# Patient Record
Sex: Female | Born: 1980 | Race: White | Hispanic: Yes | Marital: Married | Smoking: Never smoker
Health system: Southern US, Community
[De-identification: ages and names within clinical notes are randomized; demographics above are authoritative.]

---

## 2002-12-27 ENCOUNTER — Emergency Department (HOSPITAL_COMMUNITY): Admission: EM | Admit: 2002-12-27 | Discharge: 2002-12-28 | Payer: Self-pay | Admitting: Emergency Medicine

## 2003-01-27 ENCOUNTER — Inpatient Hospital Stay (HOSPITAL_COMMUNITY): Admission: AD | Admit: 2003-01-27 | Discharge: 2003-01-27 | Payer: Self-pay | Admitting: Obstetrics & Gynecology

## 2003-01-27 ENCOUNTER — Encounter (INDEPENDENT_AMBULATORY_CARE_PROVIDER_SITE_OTHER): Payer: Self-pay | Admitting: *Deleted

## 2003-01-30 ENCOUNTER — Inpatient Hospital Stay (HOSPITAL_COMMUNITY): Admission: RE | Admit: 2003-01-30 | Discharge: 2003-01-30 | Payer: Self-pay | Admitting: Obstetrics & Gynecology

## 2003-06-09 ENCOUNTER — Inpatient Hospital Stay (HOSPITAL_COMMUNITY): Admission: AD | Admit: 2003-06-09 | Discharge: 2003-06-10 | Payer: Self-pay | Admitting: Obstetrics & Gynecology

## 2003-08-30 ENCOUNTER — Ambulatory Visit (HOSPITAL_COMMUNITY): Admission: RE | Admit: 2003-08-30 | Discharge: 2003-08-30 | Payer: Self-pay | Admitting: *Deleted

## 2003-10-20 ENCOUNTER — Inpatient Hospital Stay (HOSPITAL_COMMUNITY): Admission: AD | Admit: 2003-10-20 | Discharge: 2003-10-20 | Payer: Self-pay | Admitting: *Deleted

## 2003-10-21 ENCOUNTER — Inpatient Hospital Stay (HOSPITAL_COMMUNITY): Admission: AD | Admit: 2003-10-21 | Discharge: 2003-10-21 | Payer: Self-pay | Admitting: Gynecology

## 2003-11-18 ENCOUNTER — Ambulatory Visit: Payer: Self-pay | Admitting: Family Medicine

## 2003-11-18 ENCOUNTER — Inpatient Hospital Stay (HOSPITAL_COMMUNITY): Admission: AD | Admit: 2003-11-18 | Discharge: 2003-11-20 | Payer: Self-pay | Admitting: Family Medicine

## 2003-11-29 ENCOUNTER — Ambulatory Visit (HOSPITAL_COMMUNITY): Admission: RE | Admit: 2003-11-29 | Discharge: 2003-11-29 | Payer: Self-pay | Admitting: *Deleted

## 2003-12-20 ENCOUNTER — Inpatient Hospital Stay (HOSPITAL_COMMUNITY): Admission: AD | Admit: 2003-12-20 | Discharge: 2003-12-20 | Payer: Self-pay | Admitting: *Deleted

## 2004-01-10 ENCOUNTER — Inpatient Hospital Stay (HOSPITAL_COMMUNITY): Admission: AD | Admit: 2004-01-10 | Discharge: 2004-01-10 | Payer: Self-pay | Admitting: *Deleted

## 2004-01-11 ENCOUNTER — Inpatient Hospital Stay (HOSPITAL_COMMUNITY): Admission: AD | Admit: 2004-01-11 | Discharge: 2004-01-11 | Payer: Self-pay | Admitting: *Deleted

## 2004-01-19 ENCOUNTER — Inpatient Hospital Stay (HOSPITAL_COMMUNITY): Admission: AD | Admit: 2004-01-19 | Discharge: 2004-01-19 | Payer: Self-pay | Admitting: Family Medicine

## 2004-01-23 ENCOUNTER — Inpatient Hospital Stay (HOSPITAL_COMMUNITY): Admission: RE | Admit: 2004-01-23 | Discharge: 2004-01-26 | Payer: Self-pay | Admitting: Obstetrics & Gynecology

## 2004-01-23 ENCOUNTER — Ambulatory Visit: Payer: Self-pay | Admitting: Family Medicine

## 2004-01-31 ENCOUNTER — Inpatient Hospital Stay (HOSPITAL_COMMUNITY): Admission: AD | Admit: 2004-01-31 | Discharge: 2004-01-31 | Payer: Self-pay | Admitting: *Deleted

## 2006-07-03 ENCOUNTER — Encounter: Admission: RE | Admit: 2006-07-03 | Discharge: 2006-07-03 | Payer: Self-pay | Admitting: Family Medicine

## 2006-08-23 IMAGING — US US OB FOLLOW-UP
1 series · 13 of 28 positions shown · non-contrast
Comparison: none

CLINICAL DATA: 31 week 6 day assigned gestational age.  Measuring large for dates.  Evaluate fetal growth and amniotic fluid.

[Series 1: unknown · 13 of 50 slices shown]
[im 2/50]
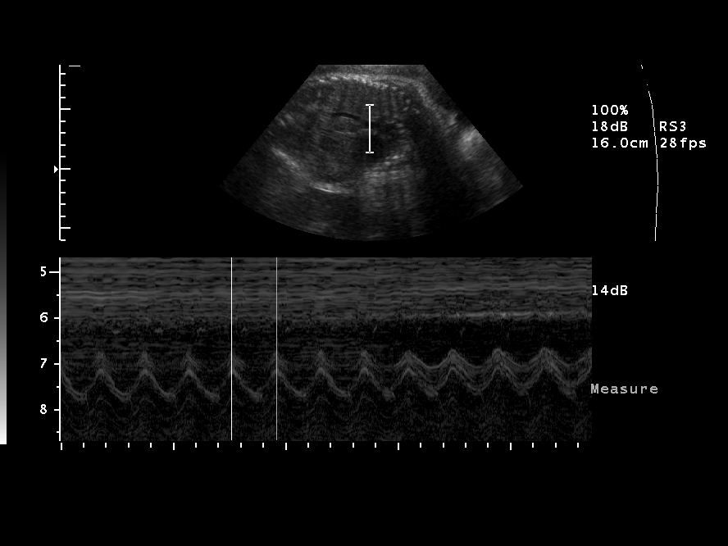
[im 6/50]
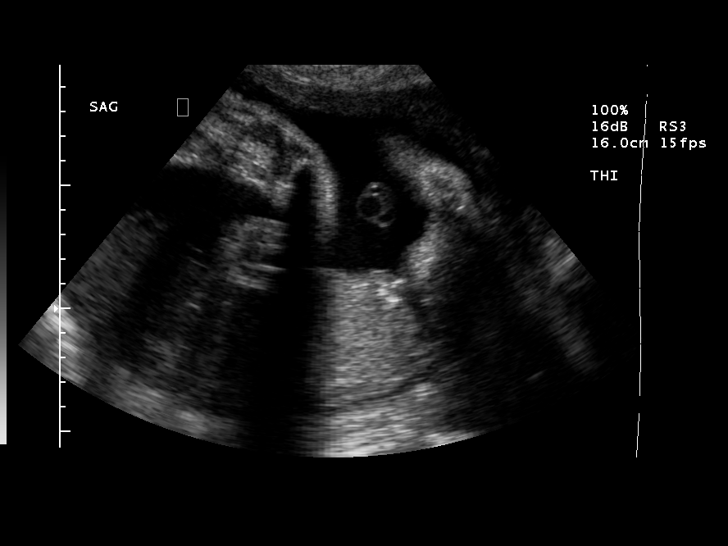
[im 10/50]
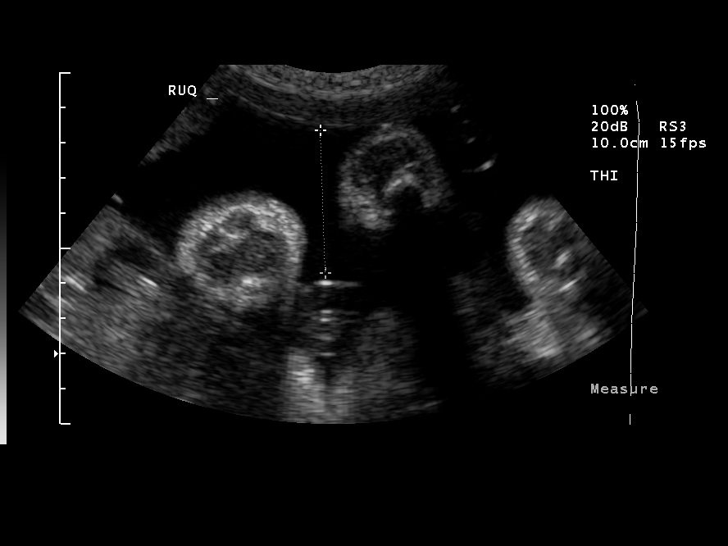
[im 13/50]
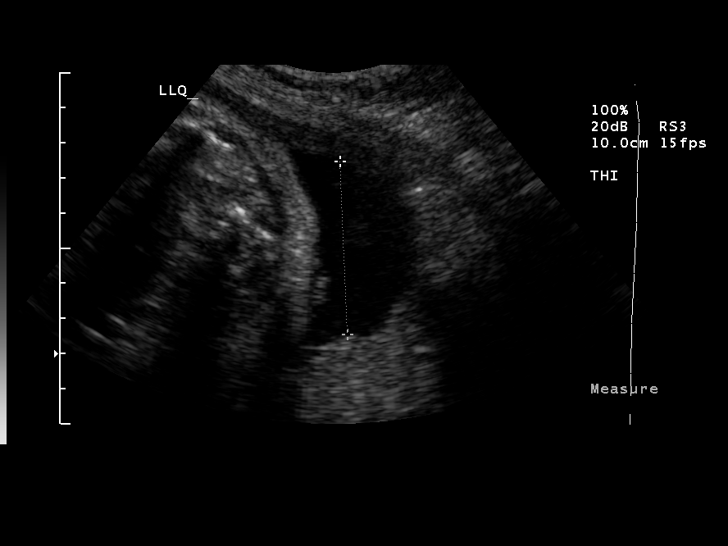
[im 17/50]
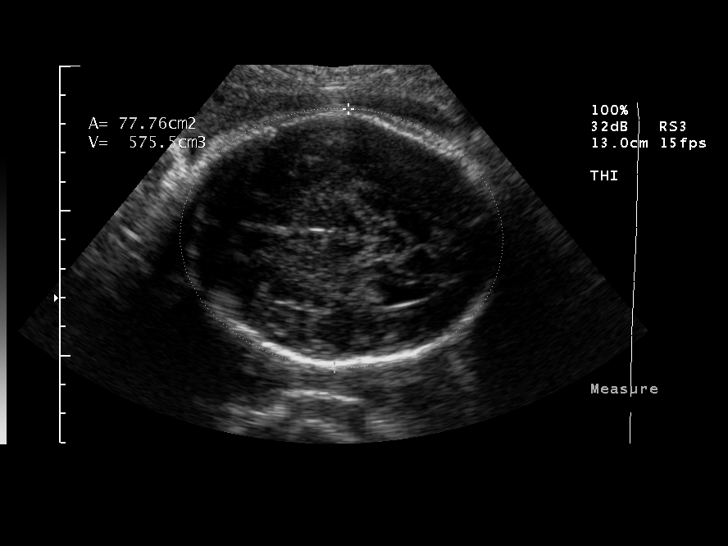
[im 20/50]
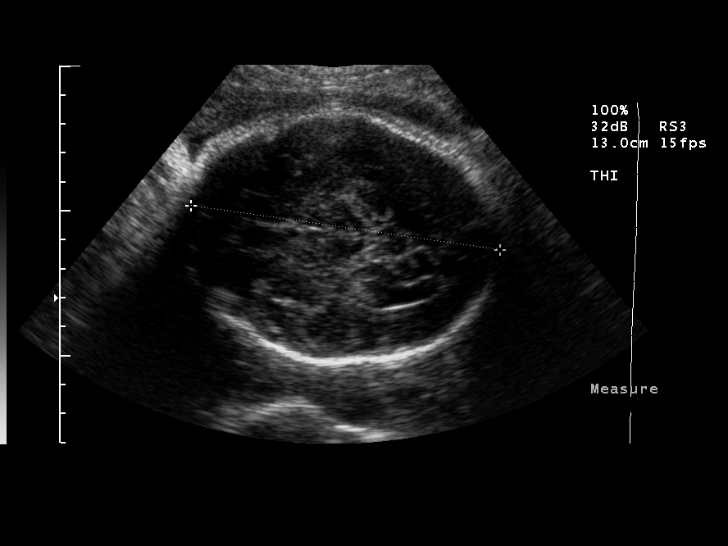
[im 26/50]
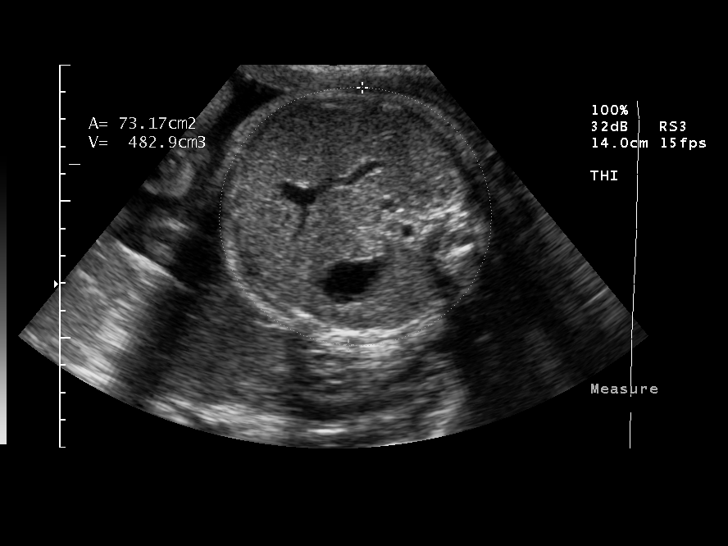
[im 30/50]
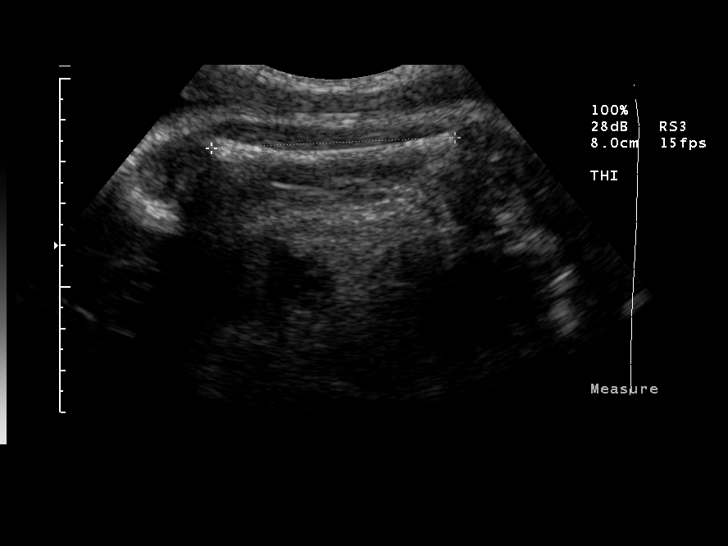
[im 33/50]
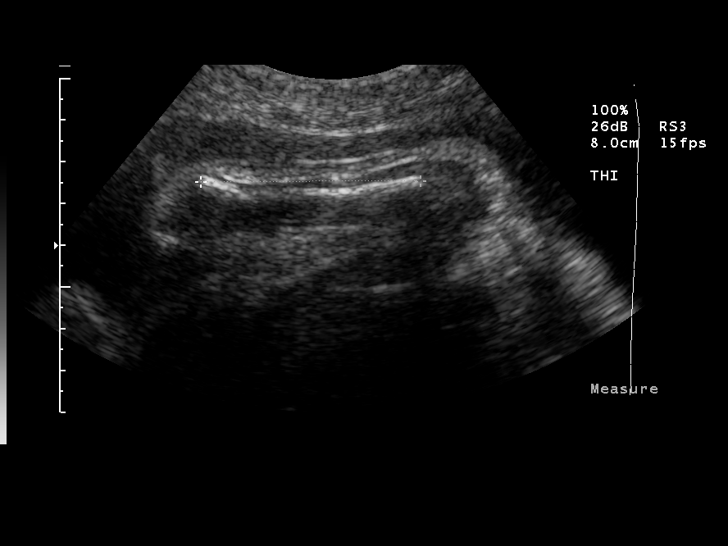
[im 37/50]
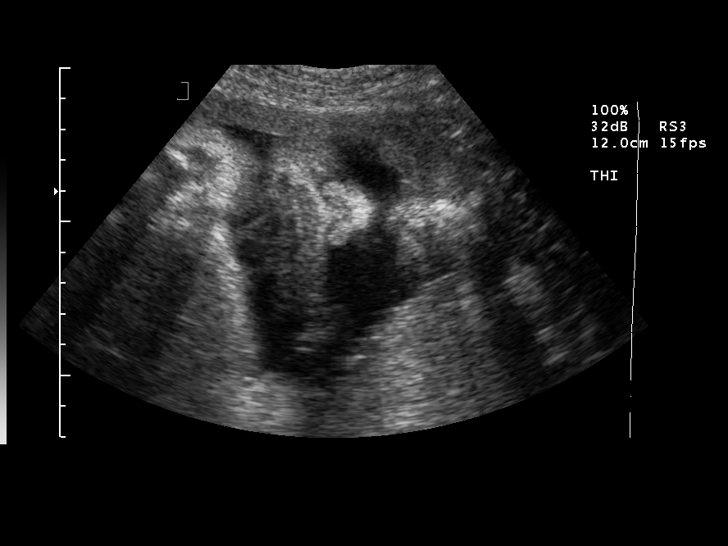
[im 40/50]
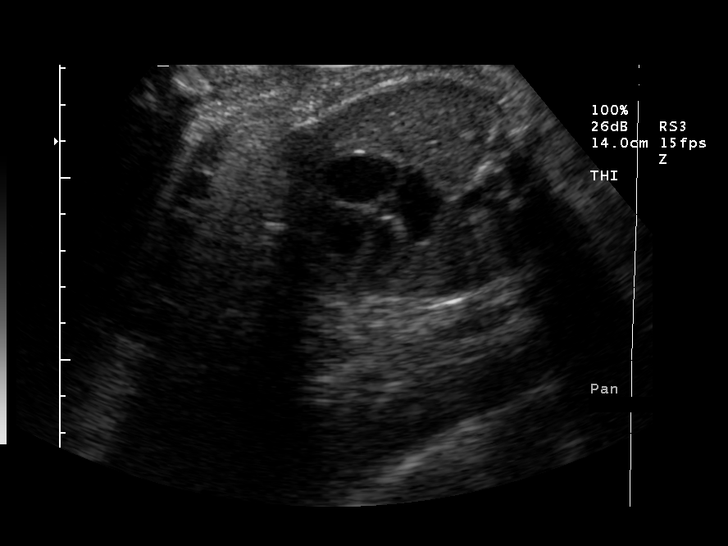
[im 44/50]
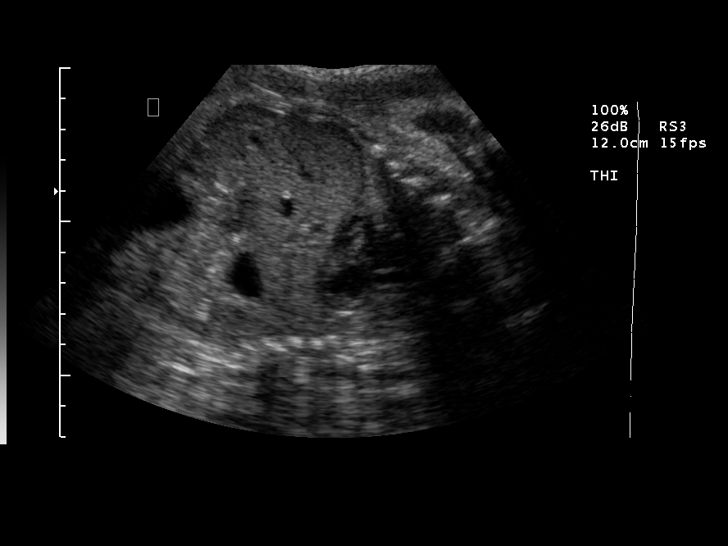
[im 48/50]
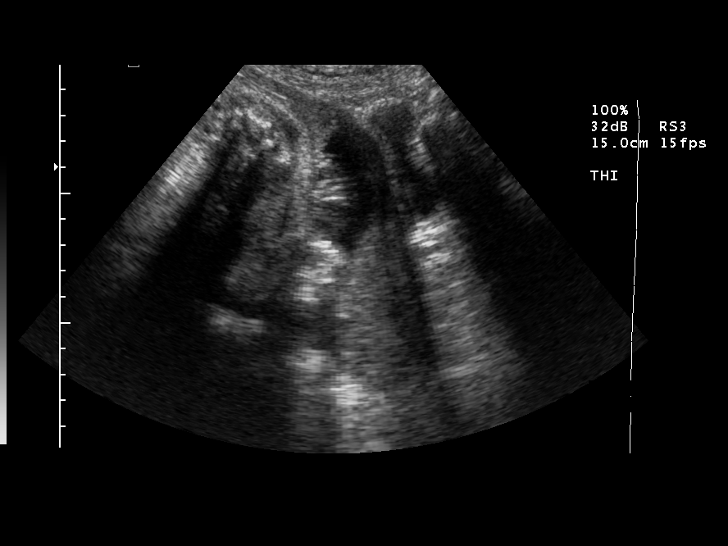

[13 of 28 positions shown; findings below may reference images not displayed]

OBSTETRICAL ULTRASOUND RE-EVALUATION
 Number of Fetuses:  1
 Heart Rate:  150
 Movement:  Yes
 Breathing:  Yes
 Presentation:  Cephalic
 Placental Location:  Posterior
 Grade:  II
 Previa:  No
 Amniotic Fluid (subjective):  Normal
 Amniotic Fluid (objective):  19.1 cm AFI (5th -95th%ile = 8.6 – 24.2 cm for 32 wks)

 FETAL BIOMETRY
 BPD:  8.4 cm   33 w 5 d
 HC:  31.9 cm  36 w 0 d
 AC:  30.1 cm   34 w 2 d
 FL:   5.9 cm   30 w 5 d
 HL:   5.3 cm   30 w 5 d
 Mean GA:  33 w 1 d
 Assigned GA:  31 w 6 d

 EFW:  4459 g (H) 90th – 95th%ile (0896 – 5154 g) for 32 wks

 FETAL ANATOMY
 Lateral Ventricles:  Visualized 
 Thalami/CSP:  Previously seen 
 Posterior Fossa:  Previously seen 
 Nuchal Region:  Previously seen 
 Spine:  Previously seen 
 4 Chamber Heart on Left:  Visualized 
 Stomach on Left:  Visualized 
 3 Vessel Cord:  Visualized 
 Cord Insertion Site:  Previously seen 
 Kidneys:  Visualized 
 Bladder:  Visualized 
 Extremities:  Previously seen 
 MATERNAL FINDINGS
 Cervix:  5.0 cm Transabdominally
IMPRESSION: Assigned gestational age is currently 31 weeks 6 days.  Abdominal circumference is greater than 2 weeks ahead, and EFW is at the 90th – 95th percentile, suspicious for fetal macrosomia.  
 Amniotic fluid volume within normal limits with AFI of 19.1 cm.

## 2008-06-08 ENCOUNTER — Inpatient Hospital Stay (HOSPITAL_COMMUNITY): Admission: AD | Admit: 2008-06-08 | Discharge: 2008-06-08 | Payer: Self-pay | Admitting: Obstetrics & Gynecology

## 2008-07-26 ENCOUNTER — Ambulatory Visit: Payer: Self-pay | Admitting: Obstetrics and Gynecology

## 2008-07-26 ENCOUNTER — Inpatient Hospital Stay (HOSPITAL_COMMUNITY): Admission: AD | Admit: 2008-07-26 | Discharge: 2008-07-26 | Payer: Self-pay | Admitting: Obstetrics & Gynecology

## 2008-09-16 ENCOUNTER — Ambulatory Visit (HOSPITAL_COMMUNITY): Admission: RE | Admit: 2008-09-16 | Discharge: 2008-09-16 | Payer: Self-pay | Admitting: Obstetrics & Gynecology

## 2008-11-29 ENCOUNTER — Inpatient Hospital Stay (HOSPITAL_COMMUNITY): Admission: AD | Admit: 2008-11-29 | Discharge: 2008-11-29 | Payer: Self-pay | Admitting: Obstetrics & Gynecology

## 2008-11-29 ENCOUNTER — Ambulatory Visit: Payer: Self-pay | Admitting: Family

## 2009-01-01 ENCOUNTER — Encounter: Payer: Self-pay | Admitting: Obstetrics & Gynecology

## 2009-01-01 ENCOUNTER — Ambulatory Visit: Payer: Self-pay | Admitting: Obstetrics & Gynecology

## 2009-01-01 ENCOUNTER — Inpatient Hospital Stay (HOSPITAL_COMMUNITY): Admission: AD | Admit: 2009-01-01 | Discharge: 2009-01-03 | Payer: Self-pay | Admitting: Obstetrics & Gynecology

## 2010-05-30 LAB — CBC
HCT: 31 % — ABNORMAL LOW (ref 36.0–46.0)
HCT: 32 % — ABNORMAL LOW (ref 36.0–46.0)
HCT: 39.1 % (ref 36.0–46.0)
Hemoglobin: 10.7 g/dL — ABNORMAL LOW (ref 12.0–15.0)
Hemoglobin: 10.9 g/dL — ABNORMAL LOW (ref 12.0–15.0)
Hemoglobin: 13.3 g/dL (ref 12.0–15.0)
MCHC: 34 g/dL (ref 30.0–36.0)
MCHC: 34.2 g/dL (ref 30.0–36.0)
MCHC: 34.3 g/dL (ref 30.0–36.0)
MCV: 97.5 fL (ref 78.0–100.0)
MCV: 97.6 fL (ref 78.0–100.0)
MCV: 97.8 fL (ref 78.0–100.0)
Platelets: 211 10*3/uL (ref 150–400)
Platelets: 235 10*3/uL (ref 150–400)
Platelets: 244 10*3/uL (ref 150–400)
RBC: 3.18 MIL/uL — ABNORMAL LOW (ref 3.87–5.11)
RBC: 3.27 MIL/uL — ABNORMAL LOW (ref 3.87–5.11)
RBC: 4.01 MIL/uL (ref 3.87–5.11)
RDW: 13.8 % (ref 11.5–15.5)
RDW: 13.9 % (ref 11.5–15.5)
RDW: 14 % (ref 11.5–15.5)
WBC: 10.1 10*3/uL (ref 4.0–10.5)
WBC: 13.5 10*3/uL — ABNORMAL HIGH (ref 4.0–10.5)
WBC: 9.1 10*3/uL (ref 4.0–10.5)

## 2010-05-30 LAB — RPR: RPR Ser Ql: NONREACTIVE

## 2010-05-31 LAB — WET PREP, GENITAL
Clue Cells Wet Prep HPF POC: NONE SEEN
Yeast Wet Prep HPF POC: NONE SEEN

## 2010-05-31 LAB — URINALYSIS, ROUTINE W REFLEX MICROSCOPIC
Bilirubin Urine: NEGATIVE
Glucose, UA: NEGATIVE mg/dL
Hgb urine dipstick: NEGATIVE
Ketones, ur: NEGATIVE mg/dL
Nitrite: NEGATIVE
Protein, ur: NEGATIVE mg/dL
Specific Gravity, Urine: <1.005 — ABNORMAL LOW (ref 1.005–1.030)
Urobilinogen, UA: 0.2 mg/dL (ref 0.0–1.0)
pH: 6 (ref 5.0–8.0)

## 2010-05-31 LAB — URINE MICROSCOPIC-ADD ON

## 2010-06-04 LAB — WET PREP, GENITAL
Clue Cells Wet Prep HPF POC: NONE SEEN
Trich, Wet Prep: NONE SEEN
Yeast Wet Prep HPF POC: NONE SEEN

## 2010-06-04 LAB — URINALYSIS, ROUTINE W REFLEX MICROSCOPIC
Bilirubin Urine: NEGATIVE
Hgb urine dipstick: NEGATIVE
Ketones, ur: NEGATIVE mg/dL
Nitrite: POSITIVE — AB
Protein, ur: NEGATIVE mg/dL
Specific Gravity, Urine: 1.015 (ref 1.005–1.030)
Urobilinogen, UA: 0.2 mg/dL (ref 0.0–1.0)

## 2010-06-04 LAB — URINE CULTURE

## 2010-06-04 LAB — URINE MICROSCOPIC-ADD ON

## 2010-06-15 ENCOUNTER — Emergency Department (HOSPITAL_COMMUNITY)
Admission: EM | Admit: 2010-06-15 | Discharge: 2010-06-15 | Disposition: A | Discharge status: Home / Self Care | Payer: Self-pay | Attending: Emergency Medicine | Admitting: Emergency Medicine

## 2010-06-15 DIAGNOSIS — R059 Cough, unspecified: Secondary | ICD-10-CM | POA: Insufficient documentation

## 2010-06-15 DIAGNOSIS — R109 Unspecified abdominal pain: Secondary | ICD-10-CM | POA: Insufficient documentation

## 2010-06-15 DIAGNOSIS — R05 Cough: Secondary | ICD-10-CM | POA: Insufficient documentation

## 2010-06-15 DIAGNOSIS — K3189 Other diseases of stomach and duodenum: Secondary | ICD-10-CM | POA: Insufficient documentation

## 2010-06-15 DIAGNOSIS — R350 Frequency of micturition: Secondary | ICD-10-CM | POA: Insufficient documentation

## 2010-06-15 DIAGNOSIS — J3489 Other specified disorders of nose and nasal sinuses: Secondary | ICD-10-CM | POA: Insufficient documentation

## 2010-06-15 DIAGNOSIS — R490 Dysphonia: Secondary | ICD-10-CM | POA: Insufficient documentation

## 2010-06-15 DIAGNOSIS — R630 Anorexia: Secondary | ICD-10-CM | POA: Insufficient documentation

## 2010-06-15 DIAGNOSIS — J029 Acute pharyngitis, unspecified: Secondary | ICD-10-CM | POA: Insufficient documentation

## 2010-06-15 DIAGNOSIS — R300 Dysuria: Secondary | ICD-10-CM | POA: Insufficient documentation

## 2010-06-15 DIAGNOSIS — R3915 Urgency of urination: Secondary | ICD-10-CM | POA: Insufficient documentation

## 2010-06-15 LAB — URINALYSIS, ROUTINE W REFLEX MICROSCOPIC
Bilirubin Urine: NEGATIVE
Glucose, UA: NEGATIVE mg/dL
Hgb urine dipstick: NEGATIVE
Specific Gravity, Urine: 1.029 (ref 1.005–1.030)
pH: 6 (ref 5.0–8.0)

## 2010-06-17 ENCOUNTER — Emergency Department (HOSPITAL_COMMUNITY): Payer: Self-pay

## 2010-06-17 ENCOUNTER — Emergency Department (HOSPITAL_COMMUNITY)
Admission: EM | Admit: 2010-06-17 | Discharge: 2010-06-17 | Disposition: A | Discharge status: Home / Self Care | Payer: Self-pay | Attending: Emergency Medicine | Admitting: Emergency Medicine

## 2010-06-17 DIAGNOSIS — R05 Cough: Secondary | ICD-10-CM | POA: Insufficient documentation

## 2010-06-17 DIAGNOSIS — R0609 Other forms of dyspnea: Secondary | ICD-10-CM | POA: Insufficient documentation

## 2010-06-17 DIAGNOSIS — J189 Pneumonia, unspecified organism: Secondary | ICD-10-CM | POA: Insufficient documentation

## 2010-06-17 DIAGNOSIS — R070 Pain in throat: Secondary | ICD-10-CM | POA: Insufficient documentation

## 2010-06-17 DIAGNOSIS — R209 Unspecified disturbances of skin sensation: Secondary | ICD-10-CM | POA: Insufficient documentation

## 2010-06-17 DIAGNOSIS — R5383 Other fatigue: Secondary | ICD-10-CM | POA: Insufficient documentation

## 2010-06-17 DIAGNOSIS — R599 Enlarged lymph nodes, unspecified: Secondary | ICD-10-CM | POA: Insufficient documentation

## 2010-06-17 DIAGNOSIS — R630 Anorexia: Secondary | ICD-10-CM | POA: Insufficient documentation

## 2010-06-17 DIAGNOSIS — R0602 Shortness of breath: Secondary | ICD-10-CM | POA: Insufficient documentation

## 2010-06-17 DIAGNOSIS — R059 Cough, unspecified: Secondary | ICD-10-CM | POA: Insufficient documentation

## 2010-06-17 DIAGNOSIS — R51 Headache: Secondary | ICD-10-CM | POA: Insufficient documentation

## 2010-06-17 DIAGNOSIS — R0989 Other specified symptoms and signs involving the circulatory and respiratory systems: Secondary | ICD-10-CM | POA: Insufficient documentation

## 2010-06-17 DIAGNOSIS — R5381 Other malaise: Secondary | ICD-10-CM | POA: Insufficient documentation

## 2010-06-18 ENCOUNTER — Emergency Department (HOSPITAL_COMMUNITY)
Admission: EM | Admit: 2010-06-18 | Discharge: 2010-06-18 | Disposition: A | Discharge status: Home / Self Care | Payer: Self-pay | Attending: Emergency Medicine | Admitting: Emergency Medicine

## 2010-06-18 DIAGNOSIS — R05 Cough: Secondary | ICD-10-CM | POA: Insufficient documentation

## 2010-06-18 DIAGNOSIS — R059 Cough, unspecified: Secondary | ICD-10-CM | POA: Insufficient documentation

## 2010-06-18 DIAGNOSIS — F410 Panic disorder [episodic paroxysmal anxiety] without agoraphobia: Secondary | ICD-10-CM | POA: Insufficient documentation

## 2010-06-18 DIAGNOSIS — R0609 Other forms of dyspnea: Secondary | ICD-10-CM | POA: Insufficient documentation

## 2010-06-18 DIAGNOSIS — J189 Pneumonia, unspecified organism: Secondary | ICD-10-CM | POA: Insufficient documentation

## 2010-06-18 DIAGNOSIS — R0989 Other specified symptoms and signs involving the circulatory and respiratory systems: Secondary | ICD-10-CM | POA: Insufficient documentation

## 2010-07-03 ENCOUNTER — Inpatient Hospital Stay (INDEPENDENT_AMBULATORY_CARE_PROVIDER_SITE_OTHER)
Admission: RE | Admit: 2010-07-03 | Discharge: 2010-07-03 | Disposition: A | Discharge status: Home / Self Care | Payer: Self-pay | Source: Ambulatory Visit | Attending: Family Medicine | Admitting: Family Medicine

## 2010-07-03 ENCOUNTER — Ambulatory Visit (INDEPENDENT_AMBULATORY_CARE_PROVIDER_SITE_OTHER): Payer: Self-pay

## 2010-07-03 DIAGNOSIS — J40 Bronchitis, not specified as acute or chronic: Secondary | ICD-10-CM

## 2010-07-13 NOTE — Discharge Summary (Signed)
NAMEMADELYNNE, LASKER      ACCOUNT NO.:  [id number]   MEDICAL RECORD NO.:  [id number]          PATIENT TYPE:  INP   LOCATION:  9157                          [facility]   PHYSICIAN:  Tanya S. Shawnie Pons, M.D.   DATE OF BIRTH:  Mar 17, 1980   DATE OF ADMISSION:  11/18/2003  DATE OF DISCHARGE:  11/20/2003                                 DISCHARGE SUMMARY   DISCHARGE DIAGNOSES:  1.  Threatened preterm labor.  2.  Abdominal pain.   HISTORY OF PRESENTING ILLNESS:  This is a 30 year old gravida 5 para 3-0-1-3  who presented at 47 and [redacted] weeks gestation by 6-week ultrasound with  abdominal pain for approximately 15 hours.  She states the pain felt like  contractions, was located at her umbilicus and radiated to her left flank.  She had no other complaints including fever, nausea, vomiting, diarrhea, or  bleeding.  She was having no changes in level of fetal activity.   HOSPITAL COURSE:  #1 - THREATENED PRETERM LABOR.  Initial cervical exam was  a fingertip with progression to fingertip to 1 cm.  She was started on  Indocin, Procardia, and magnesium sulfate, and was given her first dose of  steroids to assist in lung maturity.  She was placed on continuous uterine  monitoring, and during her course of stay she did have improvement in her  level of uterine irritability and decrease in her rate of contractions.  At  the time of discharge, her cervix was fingertip, thick, and high.   #2 - ABDOMINAL PAIN.  Following admission she underwent complete abdominal  ultrasound and transabdominal pelvic ultrasound - both of which showed no  significant abnormalities, ruling out cholecystitis, cholelithiasis, renal  calculi, ovarian torsion or cyst.  The patient's pain was treated with  morphine p.r.n.  A complete laboratory workup was performed which showed a  mildly decreased total protein with an albumin of 2.8, mild anemia at  hemoglobin 11.3.  Otherwise, her laboratory results were completely  within  normal limits.  Urinalysis was negative.  At time of discharge, her level of  pain had improved significantly, and she was discharged home with close  follow-up.   RESULTS OF LABORATORIES AND STUDIES:  Metabolic panel:  Sodium 136,  potassium 3.8, chloride 106, bicarb 23, BUN 3, creatinine 0.5, glucose 89,  calcium 8.9, protein 5.8, albumin 2.8, AST 15, ALT 12, alk phos 75,  bilirubin 0.5.  Wbc's 8.3, hemoglobin 11.8, hematocrit 33, platelet count  280.  Urinalysis negative.  Abdominal ultrasound showed a small amount of  gallbladder sludge without sonographic evidence for cholelithiasis or  cholecystitis and mild dilation of the renal collecting system consistent  with pregnancy.  Pelvic ultrasound showed right and left ovaries normal in  size and appearance with no evidence of free fluid.  Color Doppler  evaluation showed normal flow.  Obstetrical ultrasound showed a singleton  fetus with a heart rate of 145 and positive fetal movement, at presentation  was variable placental location that was posterior grade 2 without previa,  amniotic fluid index of 19.9, mean gestational age of measurements is 33  weeks 1 day.  No  fetal abnormalities were noted on the scan.   DISCHARGE INSTRUCTIONS:  1.  She is discharged home.  2.  She is to follow up at the clinic in 2 days and call for an appointment.  3.  She is to return for any signs or symptoms of labor, decreased fetal      activity, or other concerns.  4.  She is to maintain light activity.  5.  She may eat a regular diet.   DISCHARGE MEDICATIONS:  Prenatal vitamins one tablet daily.      MB/MEDQ  D:  01/13/2004  T:  01/13/2004  Job:  [id number]

## 2010-07-13 NOTE — Discharge Summary (Signed)
NAMEFELISHIA, Martha Santos      ACCOUNT NO.:  [id number]   MEDICAL RECORD NO.:  [id number]          PATIENT TYPE:  INP   LOCATION:  9157                          [facility]   PHYSICIAN:  Tanya S. Shawnie Pons, M.D.   DATE OF BIRTH:  [birth year]   DATE OF ADMISSION:  11/18/2003  DATE OF DISCHARGE:  11/20/2003                                 DISCHARGE SUMMARY   DISCHARGE DIAGNOSES:  1.  Intrauterine pregnancy at [redacted] weeks gestation.  2.  Abdominal pain.   HISTORY OF PRESENTING ILLNESS:  This is a 30 year old gravida 5 para 3-0-1-3  who presented with abdominal pain since the night before which felt somewhat  similar to contractions.  The pain did radiate to her left flank.  She  denied any fever, nausea, vomiting, or diarrhea.  Obstetrical history  significant for one vaginal delivery, two low transverse cesarean sections,  and a spontaneous abortion.   HOSPITAL COURSE:  ABDOMINAL PAIN.  She was having some irregular  contractions on the monitor in maternity admissions and was given  terbutaline with near cessation of her contractions.  She underwent an  ultrasound for amniotic fluid index and cervical length.  Cervical length  was 3.5 cm, estimated mean gestational age was 33 weeks 1 day, estimated  fetal weight greater than the 95th percentile, and follow-up ultrasound was  recommended as both head and abdominal circumference measured greater than  gestational age.  AFI was 19.9 cm.  She also underwent complete abdominal  ultrasound to rule out any pathologic abdominal sources for her pain.  The  gallbladder had a small amount of echogenic sludge but no evidence of  gallbladder wall thickening, pericystic fluid, or gallstones.  There was no  bile duct dilatation.  Right and left kidneys were normal.  Both ovaries  were normal in size and appearance.  Evidence suggesting acute appendicitis  was not visualized.  Her pain did gradually improve some throughout her  hospital stay.  She  received both tocolytics as well as Stadol and morphine  with some improvement of her pain.  At time of discharge, her pain is stable  and any pathologic sources have been ruled out.  She will be having close  follow-up following discharge with her regular obstetrical physician.   MONITOR INFORMATION:  Fetal heart rate is in the 140s with good variability,  accelerations are present, no decelerations are present.  Uterine  contractions are rare, at less than 2 per hour.   LABORATORY AND STUDIES:  Imaging studies as noted in the hospital course.  Vaginal group B strep is negative.  RPR nonreactive.  Urinalysis negative.  Metabolic panel showed a BUN of 3 with creatinine of 0.5, albumin of 2.8,  AST of 15, ALT of 12, bilirubin of 0.5.  Hemoglobin was 11.2 with hematocrit  of 33, platelet count of 280.   DISCHARGE INSTRUCTIONS:  1.  She is discharged home with her husband.  2.  She is to maintain light activity only.  3.  She is to continue to eat a healthy diet.  4.  She is to call or return for any changes in the character of  her      abdominal pain, any worsening of her pain, or any recurrence of uterine      contractions occurring in regular intervals for a prolonged period of      time, as well as for any rupture of membranes or other symptoms of      labor.  5.  She is to follow up at [facility] on Wednesday; she needs to call      for an appointment.   DISCHARGE MEDICATIONS:  1.  Prenatal vitamins one tablet p.o. daily.  2.  Tylenol 650 mg p.o. q.4-6h. as needed for pain.  3.  Tums as needed for gastroesophageal reflux symptoms.      MB/MEDQ  D:  11/20/2003  T:  11/21/2003  Job:  [id number]

## 2010-07-13 NOTE — Op Note (Signed)
Martha Santos, Martha Santos NO.:  [id number]   MEDICAL RECORD NO.:  [id number]          PATIENT TYPE:  INP   LOCATION:  9135                          [facility]  [facility]   PHYSICIAN:  Lesly Dukes, M.D. DATE OF BIRTH:  25-Jun-1980   DATE OF PROCEDURE:  01/23/2004  DATE OF DISCHARGE:                                 OPERATIVE REPORT   PREOPERATIVE DIAGNOSES:  30 year old, para 3-0-1-3 at 7 weeks estimated  gestational age for elective repeat low flap transverse cesarean section.   POSTOPERATIVE DIAGNOSES:  30 year old, para 3-0-1-3 at 52 weeks estimated  gestational age for elective repeat low flap transverse cesarean section.   PROCEDURE:  Repeat low flap transverse cesarean section.   SURGEON:  Lesly Dukes, M.D.   ASSISTANT:  Shelbie Proctor. Shawnie Pons, M.D.   ANESTHESIA:  Spinal.   ESTIMATED BLOOD LOSS:  800 mL.   IV FLUIDS:  2700 mL.   URINE OUTPUT:  400 mL clear.   FINDINGS:  Viable female infant; Apgar's 9 at 1 minute and 9 at 5 minutes;  vertex presentation; clear fluid; weight equals 9 pounds 5 ounces; normal  uterus, ovaries and fallopian tubes grossly; lower uterine segment thin but  intact; minimal scar tissue in the intraabdominal cavity; ICU team at  delivery.   DESCRIPTION OF PROCEDURE:  After informed consent was obtained, the patient  was taken to the operating room where spinal anesthesia was found to be  adequate.  There was an interpreter present during the consent for the  procedure as well as during the surgery itself. The patient was placed in  the dorsal supine position and prepared and draped in the normal sterile  fashion. There was a leftward tilt to the table as well.  The Foley was in  the bladder. A Pfannenstiel skin incision was made with the scalpel and  carried down to the underlying layer of fascia.  The fascia was incised in  the midline, this incision extended bilaterally with the Mayo scissors. The  superior and inferior aspects of  the fascial incision were grasped with  Kocher clamps, tented up and dissected off sharply and bluntly from the  underlying layers of rectus muscles.  The rectus muscles were separated in  the midline. The peritoneum was identified, tented up and entered sharply  with the Metzenbaum scissors. This incision was extended both superiorly and  inferiorly with good visualization of the bladder. The bladder blade was  inserted, vesicouterine peritoneum was identified, tented up and entered  sharply with the Metzenbaum scissors. This incision was extended bilaterally  and the bladder flap was then created digitally. The bladder blade was  reinserted. The uterus was incised in the lower uterine segment with a  scalpel and this incision was extended bilaterally with the bandage  scissors.  The amniotic sac was ruptured and clear fluid was noted.  The  baby's head delivered without incident and the nose and mouth were  suctioned. The baby's body delivered easily and the cord was clamped and cut  and the baby handed off to the waiting pediatrician. Cord blood was sent for  type and screen.  The placenta delivered  spontaneously with three vessel  cord. The uterus was cleared of all clots and debris. The uterine incision  was closed with #0 Vicryl in a running locked fashion and good hemostasis  was noted.  The gutters were cleared of all clots and debris and the adnexa  were identified and noted to be grossly normal.  The uterine incision was  noted to be hemostatic one last time. The peritoneum and rectus muscles were  hemostatic.  The fascia was closed with #0 Vicryl in a running fashion. The  subcutaneous tissue was copiously irrigated and found to be hemostatic.  The  skin was closed with staples.  The patient tolerated the procedure well.  Sponge, lap, instrument and needle counts were correct x2 and the patient  went to the recovery room in stable condition.      KHL/MEDQ  D:  01/23/2004  T:   01/23/2004  Job:  [id number]

## 2010-07-13 NOTE — Discharge Summary (Signed)
Martha Santos, Martha Santos      ACCOUNT NO.:  [id number]   MEDICAL RECORD NO.:  [id number]          PATIENT TYPE:  INP   LOCATION:  9135                          [facility]   PHYSICIAN:  Lesly Dukes, M.D. DATE OF BIRTH:  [birth year]   DATE OF ADMISSION:  01/23/2004  DATE OF DISCHARGE:  01/26/2004                                 DISCHARGE SUMMARY   DISCHARGE DIAGNOSES:  1.  A 32 week intrauterine pregnancy.  2.  Full-term delivery of a viable infant by repeat low transverse C      section.   DISCHARGE MEDICATIONS:  1.  Percocet 325/5 mg, 1-2 tabs q.4-6h. p.r.n. pain.  2.  Ibuprofen 600 mg, 1 tab q.6 h. p.r.n. for pain.  3.  Prenatal vitamins 1 tab q.d. while breast feeding.   DISCHARGE INSTRUCTIONS:  1.  Regular diet.  2.  Avoid strenuous activities for six weeks.  3.  Sexual activity, nothing in vagina for six weeks.   FOLLOW UP:  Return to [facility] in six weeks for postpartum check and  return to Maternity Admission Unit in 2-3 days for removal of staples.  To  schedule a family planning appointment to have an IUD placed after six weeks  postpartum at [facility].   HISTORY:  A 30 year old, G5, P4-0-1-4 at the time of admission that  presented at 39 weeks for a scheduled repeat __________ low transverse C  section.  The patient denied any prenatal problems.  She was GBS positive,  no allergies.  Denied significant past medical history.  OB history, the  patient had a history of low transverse C section for two and one D&C.  Family history, the patient denied any significant open __________ family  history and had a physical examination within normal limits with a gravid  abdomen and a uterine size compatible with a 40 week pregnancy.   REVIEW OF SYSTEMS:  The patient denied chest pain, shortness of breath,  nausea, vomiting or diarrhea or changes in urinary habits.   ADMISSION LABS:  . White blood cell count 6.0, hemoglobin 11.6, hematocrit  34,  platelets 269.   HOSPITAL COURSE:  On November 28, this patient had a repeat low transverse C  section and delivered a viable female infant with Apgar's of 9 at 1 minute and  9 at 5 minutes. See procedure dictation for details.  Had to manually remove  normal three vessel placenta and presented a benign postpartum coarse. She  was discharged on postpartum day #3.  Her baby was doing well and did not  require circumcision. The baby was breast feeding, had a pediatric  appointment on January 30, 2004 at 10:30 at [facility] with Dr.  Lubertha South.  This patient's blood type was A positive, RPR negative, rubella  titer immune, hepatitis B negative, GBS positive and HIV negative.  Discharge  CBC, white blood cell count 9.1, hemoglobin 10.5, hematocrit 30.5, platelets  240.  The patient and baby were discharged home in stable condition.  On the  day of discharge, the patient had no complaints, minimal pain and lochia and  remained afebrile through her postpartum course.  AM/MEDQ  D:  02/27/2004  T:  02/27/2004  Job:  [id number]

## 2010-11-14 ENCOUNTER — Inpatient Hospital Stay (INDEPENDENT_AMBULATORY_CARE_PROVIDER_SITE_OTHER)
Admission: RE | Admit: 2010-11-14 | Discharge: 2010-11-14 | Disposition: A | Discharge status: Home / Self Care | Payer: Self-pay | Source: Ambulatory Visit | Attending: Emergency Medicine | Admitting: Emergency Medicine

## 2010-11-14 DIAGNOSIS — N390 Urinary tract infection, site not specified: Secondary | ICD-10-CM

## 2010-11-14 LAB — POCT URINALYSIS DIP (DEVICE)
Bilirubin Urine: NEGATIVE
Glucose, UA: NEGATIVE mg/dL
Ketones, ur: NEGATIVE mg/dL
Nitrite: NEGATIVE
pH: 5.5 (ref 5.0–8.0)

## 2011-04-29 ENCOUNTER — Encounter (HOSPITAL_COMMUNITY): Payer: Self-pay

## 2011-04-29 ENCOUNTER — Emergency Department (HOSPITAL_COMMUNITY)
Admission: EM | Admit: 2011-04-29 | Discharge: 2011-04-29 | Disposition: A | Discharge status: Home / Self Care | Payer: Self-pay | Attending: Emergency Medicine | Admitting: Emergency Medicine

## 2011-04-29 DIAGNOSIS — H9209 Otalgia, unspecified ear: Secondary | ICD-10-CM | POA: Insufficient documentation

## 2011-04-29 DIAGNOSIS — H7290 Unspecified perforation of tympanic membrane, unspecified ear: Secondary | ICD-10-CM | POA: Insufficient documentation

## 2011-04-29 DIAGNOSIS — T169XXA Foreign body in ear, unspecified ear, initial encounter: Secondary | ICD-10-CM | POA: Insufficient documentation

## 2011-04-29 DIAGNOSIS — IMO0002 Reserved for concepts with insufficient information to code with codable children: Secondary | ICD-10-CM | POA: Insufficient documentation

## 2011-04-29 MED ORDER — AMOXICILLIN 500 MG PO CAPS
1000.0000 mg | ORAL_CAPSULE | Freq: Once | ORAL | Status: AC
  Administered 2011-04-29: 1000 mg via ORAL
  Filled 2011-04-29 (×2): qty 1

## 2011-04-29 MED ORDER — HYDROCODONE-ACETAMINOPHEN 5-325 MG PO TABS
1.0000 | ORAL_TABLET | Freq: Once | ORAL | Status: AC
  Administered 2011-04-29: 1 via ORAL
  Filled 2011-04-29: qty 1

## 2011-04-29 MED ORDER — AMOXICILLIN 500 MG PO CAPS: 1000.0000 mg | ORAL_CAPSULE | Freq: Two times a day (BID) | ORAL | Status: AC

## 2011-04-29 MED ORDER — HYDROCODONE-ACETAMINOPHEN 5-325 MG PO TABS: 1.0000 | ORAL_TABLET | ORAL | Status: AC | PRN

## 2011-04-29 NOTE — ED Provider Notes (Signed)
History     CSN: [id number]  Arrival date & time 04/29/11  1314   First MD Initiated Contact with Patient 04/29/11 1412      Chief Complaint  Patient presents with  . Otalgia    (Consider location/radiation/quality/duration/timing/severity/associated sxs/prior treatment) Patient is a 31 y.o. female presenting with ear pain. The history is provided by the patient.  Otalgia This is a new problem. The current episode started yesterday. There is pain in the right ear. The problem occurs constantly. The problem has not changed since onset.There has been no fever. Associated symptoms comments: Onset of right ear pain after using a q-tip to clean. She reports bleeding from the ear as well as decreased hearing. Marland Kitchen    History reviewed. No pertinent past medical history.  Past Surgical History  Procedure Date  . Cesarean section     History reviewed. No pertinent family history.  History  Substance Use Topics  . Smoking status: Never Smoker   . Smokeless tobacco: Not on file  . Alcohol Use: No    OB History    Grav Para Term Preterm Abortions TAB SAB Ect Mult Living                  Review of Systems  Constitutional: Negative for fever and chills.  HENT: Positive for ear pain.   Eyes: Negative.   Gastrointestinal: Negative.  Negative for nausea.  Neurological: Negative.  Negative for dizziness and light-headedness.    Allergies  Review of patient's allergies indicates no known allergies.  Home Medications  No current outpatient prescriptions on file.  BP 107/69  Pulse 86  Temp(Src) 98 F (36.7 C) (Oral)  Resp 18  SpO2 98%  LMP 04/25/2011  Physical Exam  Constitutional: She is oriented to person, place, and time. She appears well-developed and well-nourished.  HENT:       Right TM has small perforation centrally. There is a small foreign body at TM that appears to be cotton material. Blood is present in the external canal without hemotympanum.  Neck: Normal  range of motion.  Pulmonary/Chest: Effort normal.  Neurological: She is alert and oriented to person, place, and time.  Skin: Skin is warm and dry.    ED Course  Procedures (including critical care time)  Labs Reviewed - No data to display No results found.   No diagnosis found.    MDM          Rodena Medin, PA-C 04/29/11 1500

## 2011-04-29 NOTE — ED Notes (Signed)
Patient presents with right ear pain and bleeding after using a q-tip last night. Dried blood present to inner ear, no active bleeding.

## 2011-04-29 NOTE — Discharge Instructions (Signed)
FOLLOW UP WITH DR. Pollyann Kennedy FOR FURTHER EVALUATION AND TREATMENT OF RIGHT EAR INJURY AND FOREIGN BODY. TAKE MEDICATIONS AS PRESCRIBED. RETURN HERE AS NEEDED.   Cuerpo extrao en el odo  (Ear Foreign Body)  Un cuerpo extrao en el odo es un objeto que se ha insertado dentro del [place]. Estos objetos pueden causar dolor, prdida [place], zumbidos o crepitaciones. Tambin puede haber supuracin de lquido. CUIDADOS EN EL HOGAR   Cumpla con los controles mdicos segn las indicaciones.   Mantenga los objetos pequeas lejos del alcance de los nios. Dgales que no se los [employer] odos.  SOLICITE AYUDA DE INMEDIATO SI:   Observa sangre en el odo.   Siente un dolor cada vez ms intenso y observa inflamacin (hinchazn) en el odo.   Tiene problemas con la audicin.   Observa un lquido (secrecin) que proviene del odo.   Tiene fiebre.   Siente dolor de [place].  ASEGRESE DE QUE:   Comprende estas instrucciones.   Controlar su enfermedad.   Solicitar ayuda de inmediato si no mejora o si empeora.  Document Released: 01/31/2011 [facility] Patient Information 2012 [place], [place].  Tmpano perforado (Eardrum Perforation)  El tmpano (membrana timpnica) es un tejido delgado y redondeado [place] en el interior del odo, que separa el canal auditivo del odo [place]. Este tejido [employer] sonidos y Industrial/product designer. El tmpano puede pincharse o romperse (perforarse). Los tmpanos generalmente se curan sin [place] y dejan muy poca o ninguna prdida [place].  CAUSAS   Cambios repentinos en la presin que ocurren en situaciones con la prctica de buceo o viajar en avin.   Cuerpos extraos en el odo.   Insertar un hisopo con punta de algodn.   Sonidos fuertes.   Traumatismo en el odo.  SNTOMAS   Prdida auditiva.   Dolor de odos   Ruidos en el odo.   Tiene un sangrado o secrecin en el odo.   Mareos.   Vmitos.   Parlisis facial.    INSTRUCCIONES PARA EL CUIDADO DOMICILIARIO  Mantenga el odo seco para favorecer la curacin. No estn permitidas algunas [employer] natacin, el buceo y las duchas hasta que la curacin se complete. Al baarse, proteja el odo colocando un trozo de algodn cubierto con vaselina en el canal auditivo externo.   Utilice los medicamentos de venta libre o de prescripcin para Chief Technology Officer, Environmental health practitioner o la [place], segn se lo indique el profesional que lo asiste.   Suene su nariz suavemente. Si se suena con mucha fuerza, har que aumente la presin en el odo medio, lo que puede causar lesiones o demorar la curacin.   Reanude sus actividades normales como ducharse, cuando la perforacin se haya curado. El mdico le informar cuando esto haya ocurrido.   Hable con su mdico antes de viajar en avin. El tmpano perforado no le impide viajar por aire.   Si el profesional que lo [employer] pide que concurra a una cita de seguimiento, es importante asistir a ella. No concurrir a la Holiday representative como consecuencia una lesin crnica o [place], dolor, e incapacidad.  SOLICITE ATENCIN MDICA DE INMEDIATO SI:  Observa sangrado o pus en el odo.   Tiene problemas con el equilibrio, se siente mareado o presenta nuseas o vmitos.   Aumenta el dolor.   Tiene fiebre.  EST SEGURO QUE:   Comprende las instrucciones para el alta mdica.   Controlar su enfermedad.   Solicitar atencin mdica de  inmediato segn las indicaciones.  Document Released: 02/11/2005 Document Revised: 01/31/2011 [facility] Patient Information 2012 [place], [place].

## 2011-04-29 NOTE — ED Provider Notes (Signed)
Medical screening examination/treatment/procedure(s) were performed by non-physician practitioner and as supervising physician I was immediately available for consultation/collaboration.  Doug Sou, MD 04/29/11 [phone]

## 2011-05-11 ENCOUNTER — Ambulatory Visit: Payer: Self-pay | Admitting: Emergency Medicine

## 2011-05-11 DIAGNOSIS — R05 Cough: Secondary | ICD-10-CM

## 2011-05-11 DIAGNOSIS — J301 Allergic rhinitis due to pollen: Secondary | ICD-10-CM

## 2011-05-11 MED ORDER — METHYLPREDNISOLONE ACETATE 80 MG/ML IJ SUSP
80.0000 mg | Freq: Once | INTRAMUSCULAR | Status: AC
  Administered 2011-05-11: 80 mg via INTRAMUSCULAR

## 2011-05-11 MED ORDER — FLUTICASONE PROPIONATE 50 MCG/ACT NA SUSP: 2.0000 | Freq: Every day | NASAL | Status: DC

## 2011-05-11 MED ORDER — OLOPATADINE HCL 0.6 % NA SOLN: 2.0000 | Freq: Two times a day (BID) | NASAL | Status: DC

## 2011-05-11 NOTE — Progress Notes (Signed)
  Subjective:    Patient ID: Martha Santos, female    DOB: 1980-07-24, 31 y.o.   MRN: [id number]  HPI patient here with a chief complaint of severe allergies . She was seen here one year ago and she was given an injection of Depo-Medrol as well as nasal inhalers with patent nasal and Flonase  She has significant nasal congestion she denies any cough or shortness of breath. She has not had any wheezing. There is no possibility she is pregnant.  Review of Systems of systems is positive only as relates to her allergic symptoms     Objective:   Physical Exam  Constitutional: She appears well-developed.  HENT:  Head: Normocephalic.  Left Ear: External ear normal.       Right TM is scarred from a previous perforation. The nasal turbinates are swollen and blue. T he patient has a clear rhinorrhea   Neck: No JVD present. No tracheal deviation present. No thyromegaly present.  Cardiovascular: Normal rate and regular rhythm.   Pulmonary/Chest: No respiratory distress. She has no wheezes. She has no rales.  Lymphadenopathy:    She has no cervical adenopathy.          Assessment & Plan:  Assessment severe allergic rhinitis will treat with Depo-Medrol shot. As well as nasal inhalers with patanase and Flonase.

## 2011-05-11 NOTE — Patient Instructions (Signed)
Allergic Rhinitis  Allergic rhinitis is when the mucous membranes in the nose respond to allergens. Allergens are particles in the air that cause your body to have an allergic reaction. This causes you to release allergic antibodies. Through a chain of events, these eventually cause you to release histamine into the blood stream (hence the use of antihistamines). Although meant to be protective to the body, it is this release that causes your discomfort, such as frequent sneezing, congestion and an itchy runny nose.    CAUSES    The pollen allergens may come from grasses, trees, and weeds. This is seasonal allergic rhinitis, or "hay fever." Other allergens cause year-round allergic rhinitis (perennial allergic rhinitis) such as house dust mite allergen, pet dander and mold spores.    SYMPTOMS     Nasal stuffiness (congestion).   Runny, itchy nose with sneezing and tearing of the eyes.   There is often an itching of the mouth, eyes and ears.  It cannot be cured, but it can be controlled with medications.  DIAGNOSIS    If you are unable to determine the offending allergen, skin or blood testing may find it.  TREATMENT     Avoid the allergen.   Medications and allergy shots (immunotherapy) can help.   Hay fever may often be treated with antihistamines in pill or nasal spray forms. Antihistamines block the effects of histamine. There are over-the-counter medicines that may help with nasal congestion and swelling around the eyes. Check with your caregiver before taking or giving this medicine.  If the treatment above does not work, there are many new medications your caregiver can prescribe. Stronger medications may be used if initial measures are ineffective. Desensitizing injections can be used if medications and avoidance fails. Desensitization is when a patient is given ongoing shots until the body becomes less sensitive to the allergen. Make sure you follow up with your caregiver if problems continue.  SEEK  MEDICAL CARE IF:     You develop fever (more than 100.5 F (38.1 C).   You develop a cough that does not stop easily (persistent).   You have shortness of breath.   You start wheezing.   Symptoms interfere with normal daily activities.  Document Released: 11/06/2000 Document Revised: 01/31/2011 Document Reviewed: 05/18/2008  [employer] Patient Information 2012 [employer].

## 2011-08-25 ENCOUNTER — Encounter (HOSPITAL_COMMUNITY): Payer: Self-pay | Admitting: *Deleted

## 2011-08-25 ENCOUNTER — Emergency Department (HOSPITAL_COMMUNITY)
Admission: EM | Admit: 2011-08-25 | Discharge: 2011-08-25 | Disposition: A | Discharge status: Home / Self Care | Payer: No Typology Code available for payment source | Attending: Emergency Medicine | Admitting: Emergency Medicine

## 2011-08-25 DIAGNOSIS — M62830 Muscle spasm of back: Secondary | ICD-10-CM

## 2011-08-25 DIAGNOSIS — M5380 Other specified dorsopathies, site unspecified: Secondary | ICD-10-CM | POA: Insufficient documentation

## 2011-08-25 MED ORDER — HYDROMORPHONE HCL PF 1 MG/ML IJ SOLN
1.0000 mg | Freq: Once | INTRAMUSCULAR | Status: AC
  Administered 2011-08-25: 1 mg via INTRAMUSCULAR
  Filled 2011-08-25: qty 1

## 2011-08-25 MED ORDER — DIAZEPAM 5 MG PO TABS: 5.0000 mg | ORAL_TABLET | Freq: Three times a day (TID) | ORAL | Status: AC | PRN

## 2011-08-25 MED ORDER — HYDROCODONE-ACETAMINOPHEN 5-325 MG PO TABS: ORAL_TABLET | ORAL | Status: AC

## 2011-08-25 MED ORDER — DIAZEPAM 5 MG PO TABS
5.0000 mg | ORAL_TABLET | Freq: Once | ORAL | Status: AC
  Administered 2011-08-25: 5 mg via ORAL
  Filled 2011-08-25: qty 1

## 2011-08-25 MED ORDER — IBUPROFEN 400 MG PO TABS
400.0000 mg | ORAL_TABLET | Freq: Once | ORAL | Status: AC
  Administered 2011-08-25: 400 mg via ORAL
  Filled 2011-08-25: qty 1

## 2011-08-25 MED ORDER — IBUPROFEN 600 MG PO TABS: 600.0000 mg | ORAL_TABLET | Freq: Three times a day (TID) | ORAL | Status: AC
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
## 2011-08-25 NOTE — Discharge Instructions (Signed)
Narcotic and benzodiazepine use may cause drowsiness, slowed breathing or dependence.  Please use with caution and do not drive, operate machinery or watch young children alone while taking them.  Taking combinations of these medications or drinking alcohol will potentiate these effects.    
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
## 2012-03-26 ENCOUNTER — Emergency Department (HOSPITAL_COMMUNITY)
Admission: EM | Admit: 2012-03-26 | Discharge: 2012-03-26 | Disposition: A | Discharge status: Home / Self Care | Payer: No Typology Code available for payment source | Attending: Emergency Medicine | Admitting: Emergency Medicine

## 2012-03-26 ENCOUNTER — Encounter (HOSPITAL_COMMUNITY): Payer: Self-pay | Admitting: *Deleted

## 2012-03-26 ENCOUNTER — Emergency Department (HOSPITAL_COMMUNITY): Payer: No Typology Code available for payment source

## 2012-03-26 DIAGNOSIS — Y9389 Activity, other specified: Secondary | ICD-10-CM | POA: Insufficient documentation

## 2012-03-26 DIAGNOSIS — S46909A Unspecified injury of unspecified muscle, fascia and tendon at shoulder and upper arm level, unspecified arm, initial encounter: Secondary | ICD-10-CM | POA: Insufficient documentation

## 2012-03-26 DIAGNOSIS — S0993XA Unspecified injury of face, initial encounter: Secondary | ICD-10-CM | POA: Insufficient documentation

## 2012-03-26 DIAGNOSIS — S199XXA Unspecified injury of neck, initial encounter: Secondary | ICD-10-CM | POA: Insufficient documentation

## 2012-03-26 DIAGNOSIS — S4980XA Other specified injuries of shoulder and upper arm, unspecified arm, initial encounter: Secondary | ICD-10-CM | POA: Insufficient documentation

## 2012-03-26 DIAGNOSIS — M79602 Pain in left arm: Secondary | ICD-10-CM

## 2012-03-26 DIAGNOSIS — Y92410 Unspecified street and highway as the place of occurrence of the external cause: Secondary | ICD-10-CM | POA: Insufficient documentation

## 2012-03-26 MED ORDER — METHOCARBAMOL 500 MG PO TABS: 500.0000 mg | ORAL_TABLET | Freq: Two times a day (BID) | ORAL | Status: DC

## 2012-03-26 MED ORDER — OXYCODONE-ACETAMINOPHEN 5-325 MG PO TABS
2.0000 | ORAL_TABLET | Freq: Once | ORAL | Status: AC
  Administered 2012-03-26: 2 via ORAL
  Filled 2012-03-26: qty 2

## 2012-03-26 MED ORDER — OXYCODONE-ACETAMINOPHEN 5-325 MG PO TABS: 1.0000 | ORAL_TABLET | ORAL | Status: DC | PRN
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
## 2012-03-26 NOTE — Progress Notes (Signed)
Orthopedic Tech Progress Note Patient Details:  Martha Santos [birth year] [id number]  Ortho Devices Type of Ortho Device: Arm sling Ortho Device/Splint Location: (L) UE Ortho Device/Splint Interventions: Application   Jennye Moccasin 03/26/2012, 10:59 PM

## 2012-03-26 NOTE — ED Notes (Signed)
Pt given discharge paperwork; no additional questions by pt; pt verbalized understanding of discharge; e-signature obtained; VSS; 
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
## 2012-04-01 NOTE — ED Provider Notes (Signed)
Medical screening examination/treatment/procedure(s) were performed by non-physician practitioner and as supervising physician I was immediately available for consultation/collaboration.   Gavin Pound. Maudene Stotler, MD 04/01/12 1402

## 2012-04-06 ENCOUNTER — Ambulatory Visit: Payer: No Typology Code available for payment source | Attending: Orthopedic Surgery | Admitting: Physical Therapy

## 2012-04-06 DIAGNOSIS — M542 Cervicalgia: Secondary | ICD-10-CM | POA: Insufficient documentation

## 2012-04-06 DIAGNOSIS — M25519 Pain in unspecified shoulder: Secondary | ICD-10-CM | POA: Insufficient documentation

## 2012-04-06 DIAGNOSIS — M2569 Stiffness of other specified joint, not elsewhere classified: Secondary | ICD-10-CM | POA: Insufficient documentation

## 2012-04-06 DIAGNOSIS — IMO0001 Reserved for inherently not codable concepts without codable children: Secondary | ICD-10-CM | POA: Insufficient documentation

## 2012-04-08 ENCOUNTER — Ambulatory Visit: Payer: No Typology Code available for payment source | Admitting: Physical Therapy

## 2012-04-13 ENCOUNTER — Ambulatory Visit: Payer: No Typology Code available for payment source | Admitting: Physical Therapy

## 2012-04-14 ENCOUNTER — Ambulatory Visit: Payer: No Typology Code available for payment source | Admitting: Physical Therapy

## 2012-04-15 ENCOUNTER — Ambulatory Visit: Payer: No Typology Code available for payment source | Admitting: Physical Therapy

## 2012-04-22 ENCOUNTER — Ambulatory Visit: Payer: No Typology Code available for payment source | Admitting: Rehabilitation

## 2012-04-23 ENCOUNTER — Ambulatory Visit: Payer: No Typology Code available for payment source | Admitting: Physical Therapy

## 2012-04-28 ENCOUNTER — Ambulatory Visit: Payer: No Typology Code available for payment source | Attending: Orthopedic Surgery | Admitting: Physical Therapy

## 2012-04-28 DIAGNOSIS — IMO0001 Reserved for inherently not codable concepts without codable children: Secondary | ICD-10-CM | POA: Insufficient documentation

## 2012-04-28 DIAGNOSIS — M2569 Stiffness of other specified joint, not elsewhere classified: Secondary | ICD-10-CM | POA: Insufficient documentation

## 2012-04-28 DIAGNOSIS — M542 Cervicalgia: Secondary | ICD-10-CM | POA: Insufficient documentation

## 2012-04-28 DIAGNOSIS — M25519 Pain in unspecified shoulder: Secondary | ICD-10-CM | POA: Insufficient documentation

## 2012-04-29 ENCOUNTER — Ambulatory Visit: Payer: No Typology Code available for payment source | Admitting: Physical Therapy

## 2012-09-11 ENCOUNTER — Emergency Department (HOSPITAL_COMMUNITY)
Admission: EM | Admit: 2012-09-11 | Discharge: 2012-09-12 | Disposition: A | Discharge status: Home / Self Care | Payer: Self-pay | Attending: Emergency Medicine | Admitting: Emergency Medicine

## 2012-09-11 ENCOUNTER — Encounter (HOSPITAL_COMMUNITY): Payer: Self-pay

## 2012-09-11 DIAGNOSIS — Z87828 Personal history of other (healed) physical injury and trauma: Secondary | ICD-10-CM | POA: Insufficient documentation

## 2012-09-11 DIAGNOSIS — T394X2A Poisoning by antirheumatics, not elsewhere classified, intentional self-harm, initial encounter: Secondary | ICD-10-CM | POA: Insufficient documentation

## 2012-09-11 DIAGNOSIS — F329 Major depressive disorder, single episode, unspecified: Secondary | ICD-10-CM | POA: Insufficient documentation

## 2012-09-11 DIAGNOSIS — T398X2A Poisoning by other nonopioid analgesics and antipyretics, not elsewhere classified, intentional self-harm, initial encounter: Secondary | ICD-10-CM | POA: Insufficient documentation

## 2012-09-11 DIAGNOSIS — T400X1A Poisoning by opium, accidental (unintentional), initial encounter: Secondary | ICD-10-CM | POA: Insufficient documentation

## 2012-09-11 DIAGNOSIS — Z79899 Other long term (current) drug therapy: Secondary | ICD-10-CM | POA: Insufficient documentation

## 2012-09-11 DIAGNOSIS — T1491XA Suicide attempt, initial encounter: Secondary | ICD-10-CM

## 2012-09-11 DIAGNOSIS — R45851 Suicidal ideations: Secondary | ICD-10-CM | POA: Insufficient documentation

## 2012-09-11 DIAGNOSIS — F3289 Other specified depressive episodes: Secondary | ICD-10-CM | POA: Insufficient documentation

## 2012-09-11 DIAGNOSIS — Z3202 Encounter for pregnancy test, result negative: Secondary | ICD-10-CM | POA: Insufficient documentation
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
## 2012-09-12 DIAGNOSIS — F329 Major depressive disorder, single episode, unspecified: Secondary | ICD-10-CM

## 2012-09-12 DIAGNOSIS — F39 Unspecified mood [affective] disorder: Secondary | ICD-10-CM

## 2012-09-12 LAB — URINALYSIS, ROUTINE W REFLEX MICROSCOPIC
Hgb urine dipstick: NEGATIVE
Ketones, ur: NEGATIVE mg/dL
Protein, ur: NEGATIVE mg/dL
Urobilinogen, UA: 0.2 mg/dL (ref 0.0–1.0)

## 2012-09-12 LAB — RAPID URINE DRUG SCREEN, HOSP PERFORMED
Amphetamines: NOT DETECTED
Barbiturates: NOT DETECTED
Cocaine: NOT DETECTED
Tetrahydrocannabinol: NOT DETECTED

## 2012-09-12 LAB — COMPREHENSIVE METABOLIC PANEL
ALT: 14 U/L (ref 0–35)
Albumin: 3.7 g/dL (ref 3.5–5.2)
Alkaline Phosphatase: 60 U/L (ref 39–117)
BUN: 8 mg/dL (ref 6–23)
Chloride: 99 mEq/L (ref 96–112)
GFR calc Af Amer: >90 mL/min (ref >90)
Glucose, Bld: 107 mg/dL — ABNORMAL HIGH (ref 70–99)
Potassium: 3.1 mEq/L — ABNORMAL LOW (ref 3.5–5.1)
Total Bilirubin: 0.2 mg/dL — ABNORMAL LOW (ref 0.3–1.2)

## 2012-09-12 LAB — PREGNANCY, URINE: Preg Test, Ur: NEGATIVE

## 2012-09-12 LAB — CBC
HCT: 31.7 % — ABNORMAL LOW (ref 36.0–46.0)
Hemoglobin: 10.5 g/dL — ABNORMAL LOW (ref 12.0–15.0)
RBC: 3.86 MIL/uL — ABNORMAL LOW (ref 3.87–5.11)
WBC: 6 10*3/uL (ref 4.0–10.5)

## 2012-09-12 LAB — PROTIME-INR: Prothrombin Time: 13.4 seconds (ref 11.6–15.2)

## 2012-09-12 LAB — ETHANOL: Alcohol, Ethyl (B): <11 mg/dL (ref 0–11)

## 2012-09-12 MED ORDER — POTASSIUM CHLORIDE CRYS ER 20 MEQ PO TBCR
40.0000 meq | EXTENDED_RELEASE_TABLET | Freq: Once | ORAL | Status: AC
  Administered 2012-09-12: 40 meq via ORAL

## 2012-09-12 MED ORDER — ONDANSETRON HCL 4 MG PO TABS: 4.0000 mg | ORAL_TABLET | Freq: Three times a day (TID) | ORAL | Status: DC | PRN

## 2012-09-12 MED ORDER — ALUM & MAG HYDROXIDE-SIMETH 200-200-20 MG/5ML PO SUSP: 30.0000 mL | ORAL | Status: DC | PRN

## 2012-09-12 NOTE — ED Notes (Signed)
Percocet sent home with her husband. He signed belongings sheet stating he took them home.

## 2012-09-12 NOTE — [facility] Counselor (Signed)
Contacted [facility] for a spanish interpreter. Dr. Lolly Mustache requesting a  face to face evaluation with a translator present.

## 2012-09-12 NOTE — ED Notes (Signed)
Pt . Has one Pt. belonging bag in locker #26.
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
## 2012-09-12 NOTE — ED Notes (Signed)
D/C instructions given to her husband as he understands English better than the patient. Ambulatory without difficulty. Denies pain. Belongings bag x1 returned. Escorted via mental health tech to the front of hospital.

## 2017-06-04 ENCOUNTER — Ambulatory Visit (INDEPENDENT_AMBULATORY_CARE_PROVIDER_SITE_OTHER): Payer: Self-pay | Admitting: Physician Assistant

## 2017-06-04 ENCOUNTER — Encounter: Payer: Self-pay | Admitting: Physician Assistant

## 2017-06-04 VITALS — BP 131/71 | HR 81 | Temp 98.4°F | Resp 17 | Ht 59.5 in | Wt 157.0 lb

## 2017-06-04 DIAGNOSIS — J302 Other seasonal allergic rhinitis: Secondary | ICD-10-CM

## 2017-06-04 LAB — GLUCOSE, POCT (MANUAL RESULT ENTRY): POC Glucose: 95 mg/dl (ref 70–99)

## 2017-06-04 MED ORDER — LEVOCETIRIZINE DIHYDROCHLORIDE 5 MG PO TABS
5.0000 mg | ORAL_TABLET | Freq: Every evening | ORAL | 3 refills | Status: AC
Start: 2017-06-04 — End: ?

## 2017-06-04 MED ORDER — METHYLPREDNISOLONE ACETATE 80 MG/ML IJ SUSP
80.0000 mg | Freq: Once | INTRAMUSCULAR | Status: AC
  Administered 2017-06-04: 80 mg via INTRAMUSCULAR

## 2017-06-04 MED ORDER — FLUTICASONE PROPIONATE 50 MCG/ACT NA SUSP: 2.0000 | Freq: Every day | NASAL | 6 refills | Status: AC
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
## 2017-06-04 NOTE — Patient Instructions (Signed)
     IF you received an x-ray today, you will receive an invoice from [facility]. Please contact [facility] at [phone] with questions or concerns regarding your invoice.   IF you received labwork today, you will receive an invoice from [facility]. Please contact [facility] at [phone] with questions or concerns regarding your invoice.   Our billing staff will not be able to assist you with questions regarding bills from these companies.  You will be contacted with the lab results as soon as they are available. The fastest way to get your results is to activate your My Chart account. Instructions are located on the last page of this paperwork. If you have not heard from us regarding the results in 2 weeks, please contact this office.     
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
## 2018-05-26 ENCOUNTER — Telehealth: Payer: Self-pay | Admitting: Family Medicine

## 2018-05-26 ENCOUNTER — Other Ambulatory Visit: Payer: Self-pay

## 2019-10-20 ENCOUNTER — Ambulatory Visit: Payer: Self-pay

## 2019-10-20 NOTE — Telephone Encounter (Signed)
Patient's daughter called and says the patient speaks Spanish and will need an interpreter. Using [facility] [phone], patient says she was bit by a tick on yesterday and it was removed from the top of her right foot at the bend today. She says it was a tiny tick about the size of a watermelon seed, the head was attached. She says the area is not red around where the tick was removed, but the bite is red. She says it bled after removing the tick. She denies any symptoms. Advised she doesn't need an appointment, home care advice given. She asks about receiving a tetanus shot and how much will it be at [place]. I called [place] and spoke to Maralyn Sago, [facility], the call was connected.    Reason for Disposition . Wood tick bite with no complications  Answer Assessment - Initial Assessment Questions 1. TYPE of TICK: "Is it a wood tick or a deer tick?" If unsure, ask: "What size was the tick?" "Did it look more like a watermelon seed or a poppy seed?"      Very small, baby tick, watermelon seeds 2. LOCATION: "Where is the tick bite located?"      Right front of foot at the bend 3. ONSET: "How long do you think the tick was attached before you removed it?" (Hours or days)      It was seen yesterday, but didn't realize what it was. I pulled it off today, head was attached 4. TETANUS: "When was the last tetanus booster?"      I don't know 5. PREGNANCY: "Is there any chance you are pregnant?" "When was your last menstrual period?"     No  Protocols used: TICK BITE-A-AH

## 2020-11-01 ENCOUNTER — Emergency Department (HOSPITAL_COMMUNITY)
Admission: EM | Admit: 2020-11-01 | Discharge: 2020-11-01 | Disposition: A | Discharge status: Home / Self Care | Payer: Self-pay | Attending: Emergency Medicine | Admitting: Emergency Medicine

## 2020-11-01 ENCOUNTER — Encounter (HOSPITAL_COMMUNITY): Payer: Self-pay

## 2020-11-01 ENCOUNTER — Other Ambulatory Visit: Payer: Self-pay

## 2020-11-01 DIAGNOSIS — L299 Pruritus, unspecified: Secondary | ICD-10-CM | POA: Insufficient documentation

## 2020-11-01 DIAGNOSIS — B029 Zoster without complications: Secondary | ICD-10-CM

## 2020-11-01 MED ORDER — HYDROCORTISONE 1 % EX CREA: TOPICAL_CREAM | CUTANEOUS | 0 refills | Status: AC

## 2020-11-01 NOTE — ED Triage Notes (Signed)
Pt reports painful rash to right back for about a week. Pt denies rash anywhere else.

## 2020-11-01 NOTE — Discharge Instructions (Addendum)
You came to the emergency department today to have your rash evaluated.  Based on your physical exam have shingles also known as herpes zoster.  Due to your itching I have given you prescription for hydrocortisone cream.  Please use this medication as prescribed.  Please follow-up with your primary care provider.  Get help right away if: The rash is on your face or nose. You have facial pain, pain around your eye area, or loss of feeling on one side of your face. You have difficulty seeing. You have ear pain or have ringing in your ear. You have a loss of taste. Your condition gets worse.
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
